# Patient Record
Sex: Female | Born: 1980 | Hispanic: No | Marital: Single | Smoking: Never smoker
Health system: Southern US, Community
[De-identification: ages and names within clinical notes are randomized; demographics above are authoritative.]

---

## 2000-11-16 ENCOUNTER — Inpatient Hospital Stay (HOSPITAL_COMMUNITY): Admission: AD | Admit: 2000-11-16 | Discharge: 2000-11-16 | Payer: Self-pay | Admitting: Internal Medicine

## 2000-11-16 ENCOUNTER — Encounter: Payer: Self-pay | Admitting: Gynecology

## 2001-02-25 ENCOUNTER — Inpatient Hospital Stay (HOSPITAL_COMMUNITY): Admission: AD | Admit: 2001-02-25 | Discharge: 2001-02-25 | Payer: Self-pay | Admitting: *Deleted

## 2001-02-25 ENCOUNTER — Inpatient Hospital Stay (HOSPITAL_COMMUNITY): Admission: AD | Admit: 2001-02-25 | Discharge: 2001-02-27 | Payer: Self-pay | Admitting: Gynecology

## 2001-04-21 ENCOUNTER — Other Ambulatory Visit: Admission: RE | Admit: 2001-04-21 | Discharge: 2001-04-21 | Payer: Self-pay | Admitting: Gynecology

## 2003-03-08 ENCOUNTER — Inpatient Hospital Stay (HOSPITAL_COMMUNITY): Admission: RE | Admit: 2003-03-08 | Discharge: 2003-03-09 | Payer: Self-pay | Admitting: Obstetrics and Gynecology

## 2018-01-01 ENCOUNTER — Other Ambulatory Visit (HOSPITAL_COMMUNITY): Payer: Self-pay | Admitting: Nurse Practitioner

## 2018-01-01 DIAGNOSIS — R102 Pelvic and perineal pain: Secondary | ICD-10-CM

## 2018-01-09 ENCOUNTER — Ambulatory Visit (HOSPITAL_COMMUNITY)
Admission: RE | Admit: 2018-01-09 | Discharge: 2018-01-09 | Disposition: A | Discharge status: Home / Self Care | Payer: Self-pay | Source: Ambulatory Visit | Attending: Nurse Practitioner | Admitting: Nurse Practitioner

## 2018-01-09 DIAGNOSIS — R102 Pelvic and perineal pain: Secondary | ICD-10-CM | POA: Insufficient documentation

## 2018-01-15 ENCOUNTER — Other Ambulatory Visit (HOSPITAL_COMMUNITY): Payer: Self-pay | Admitting: Nurse Practitioner

## 2018-01-15 DIAGNOSIS — N2889 Other specified disorders of kidney and ureter: Secondary | ICD-10-CM

## 2018-01-20 ENCOUNTER — Ambulatory Visit (HOSPITAL_COMMUNITY)
Admission: RE | Admit: 2018-01-20 | Discharge: 2018-01-20 | Disposition: A | Discharge status: Home / Self Care | Payer: Self-pay | Source: Ambulatory Visit | Attending: Nurse Practitioner | Admitting: Nurse Practitioner

## 2018-01-20 DIAGNOSIS — R102 Pelvic and perineal pain: Secondary | ICD-10-CM | POA: Insufficient documentation

## 2018-01-20 DIAGNOSIS — N2889 Other specified disorders of kidney and ureter: Secondary | ICD-10-CM | POA: Insufficient documentation

## 2018-03-10 ENCOUNTER — Ambulatory Visit (INDEPENDENT_AMBULATORY_CARE_PROVIDER_SITE_OTHER): Payer: Self-pay | Admitting: Urology

## 2018-03-10 DIAGNOSIS — Q6231 Congenital ureterocele, orthotopic: Secondary | ICD-10-CM

## 2018-03-11 ENCOUNTER — Other Ambulatory Visit: Payer: Self-pay | Admitting: Urology

## 2018-03-11 DIAGNOSIS — Q6231 Congenital ureterocele, orthotopic: Secondary | ICD-10-CM

## 2018-07-05 ENCOUNTER — Other Ambulatory Visit: Payer: Self-pay

## 2018-07-05 ENCOUNTER — Emergency Department (HOSPITAL_COMMUNITY)
Admission: EM | Admit: 2018-07-05 | Discharge: 2018-07-05 | Disposition: A | Discharge status: Home / Self Care | Payer: Self-pay | Attending: Emergency Medicine | Admitting: Emergency Medicine

## 2018-07-05 ENCOUNTER — Encounter (HOSPITAL_COMMUNITY): Payer: Self-pay

## 2018-07-05 ENCOUNTER — Emergency Department (HOSPITAL_COMMUNITY): Payer: Self-pay

## 2018-07-05 DIAGNOSIS — R102 Pelvic and perineal pain: Secondary | ICD-10-CM

## 2018-07-05 DIAGNOSIS — N939 Abnormal uterine and vaginal bleeding, unspecified: Secondary | ICD-10-CM | POA: Insufficient documentation

## 2018-07-05 LAB — URINALYSIS, ROUTINE W REFLEX MICROSCOPIC
Bacteria, UA: NONE SEEN
Bilirubin Urine: NEGATIVE
Glucose, UA: NEGATIVE mg/dL
Ketones, ur: NEGATIVE mg/dL
Leukocytes, UA: NEGATIVE
Nitrite: NEGATIVE
Protein, ur: NEGATIVE mg/dL
Specific Gravity, Urine: 1.009 (ref 1.005–1.030)
pH: 6 (ref 5.0–8.0)

## 2018-07-05 LAB — CBC WITH DIFFERENTIAL/PLATELET
BASOS PCT: 0 %
Basophils Absolute: 0 10*3/uL (ref 0.0–0.1)
EOS PCT: 2 %
Eosinophils Absolute: 0.2 10*3/uL (ref 0.0–0.7)
HCT: 39.7 % (ref 36.0–46.0)
Hemoglobin: 13.1 g/dL (ref 12.0–15.0)
Lymphocytes Relative: 35 %
Lymphs Abs: 3.5 10*3/uL (ref 0.7–4.0)
MCH: 31.1 pg (ref 26.0–34.0)
MCHC: 33 g/dL (ref 30.0–36.0)
MCV: 94.3 fL (ref 78.0–100.0)
MONO ABS: 0.6 10*3/uL (ref 0.1–1.0)
Monocytes Relative: 6 %
Neutro Abs: 5.8 10*3/uL (ref 1.7–7.7)
Neutrophils Relative %: 57 %
PLATELETS: 291 10*3/uL (ref 150–400)
RBC: 4.21 MIL/uL (ref 3.87–5.11)
RDW: 13.5 % (ref 11.5–15.5)
WBC: 10.1 10*3/uL (ref 4.0–10.5)

## 2018-07-05 LAB — BASIC METABOLIC PANEL
ANION GAP: 6 (ref 5–15)
BUN: 14 mg/dL (ref 6–20)
CALCIUM: 8.9 mg/dL (ref 8.9–10.3)
CO2: 22 mmol/L (ref 22–32)
Chloride: 110 mmol/L (ref 98–111)
Creatinine, Ser: 0.46 mg/dL (ref 0.44–1.00)
GFR calc Af Amer: >60 mL/min (ref >60)
GLUCOSE: 109 mg/dL — AB (ref 70–99)
Potassium: 4.1 mmol/L (ref 3.5–5.1)
Sodium: 138 mmol/L (ref 135–145)

## 2018-07-05 LAB — PREGNANCY, URINE: PREG TEST UR: NEGATIVE

## 2018-07-05 MED ORDER — MEGESTROL ACETATE 40 MG PO TABS: 80.0000 mg | ORAL_TABLET | Freq: Two times a day (BID) | ORAL | 0 refills | Status: AC

## 2018-07-05 MED ORDER — MEGESTROL ACETATE 40 MG PO TABS
80.0000 mg | ORAL_TABLET | Freq: Once | ORAL | Status: AC
  Administered 2018-07-05: 80 mg via ORAL
  Filled 2018-07-05 (×2): qty 2

## 2018-07-05 MED ORDER — OXYCODONE-ACETAMINOPHEN 5-325 MG PO TABS
ORAL_TABLET | ORAL | Status: AC
  Filled 2018-07-05: qty 2

## 2018-07-05 MED ORDER — OXYCODONE-ACETAMINOPHEN 5-325 MG PO TABS
2.0000 | ORAL_TABLET | Freq: Once | ORAL | Status: AC
  Administered 2018-07-05: 2 via ORAL

## 2018-07-05 NOTE — ED Triage Notes (Signed)
Pt c/o heavy bleeding with pain on the lt side x 3days

## 2018-07-05 NOTE — ED Provider Notes (Signed)
[facility] EMERGENCY DEPARTMENT Provider Note   CSN: [id number] Arrival date & time: 07/05/18  0425     History   Chief Complaint Chief Complaint  Patient presents with  . Menstrual Problem    HPI Misty Sandoval is a 37 y.o. female.  Patient is a 37 year old female presenting with complaints of vaginal bleeding and left lower quadrant pain.  She states that she started her menstrual period on August 28 and reports continued bleeding since.  She is also now experiencing some pain in her left lower quadrant.  She denies any fevers or chills.  She was seen by another physician for this complaint and was started on Provera.  She has been taking 5 mg daily for the past 9 days, however this has not helped.  She denies any fevers or chills.  She denies any urinary complaints.  The history is provided by the patient.    History reviewed. No pertinent past medical history.  There are no active problems to display for this patient.   History reviewed. No pertinent surgical history.   OB History    Gravida  2   Para  2   Term      Preterm      AB      Living        SAB      TAB      Ectopic      Multiple      Live Births               Home Medications    Prior to Admission medications   Not on File    Family History History reviewed. No pertinent family history.  Social History Social History   Tobacco Use  . Smoking status: Never Smoker  . Smokeless tobacco: Never Used  Substance Use Topics  . Alcohol use: Never    Frequency: Never  . Drug use: Never     Allergies   Patient has no allergy information on record.   Review of Systems Review of Systems  All other systems reviewed and are negative.    Physical Exam Updated Vital Signs BP (!) 152/92 (BP Location: Left Arm)   Pulse 98   Temp 98 F (36.7 C) (Oral)   Resp 18   Ht 5\' 4"  (1.626 m)   Wt 68 kg   LMP 07/05/2018 Comment: on it now  SpO2 98%   BMI 25.75 kg/m     Physical Exam  Constitutional: She is oriented to person, place, and time. She appears well-developed and well-nourished. No distress.  HENT:  Head: Normocephalic and atraumatic.  Neck: Normal range of motion. Neck supple.  Cardiovascular: Normal rate and regular rhythm. Exam reveals no gallop and no friction rub.  No murmur heard. Pulmonary/Chest: Effort normal and breath sounds normal. No respiratory distress. She has no wheezes.  Abdominal: Soft. Bowel sounds are normal. She exhibits no distension. There is tenderness. There is no rebound and no guarding.  There is mild tenderness to palpation in the left lower quadrant.  Genitourinary:  Genitourinary Comments: Pelvic exam just reveals blood in the vaginal vault.  There is some tenderness in the left adnexa, however no palpable mass.  Musculoskeletal: Normal range of motion.  Neurological: She is alert and oriented to person, place, and time.  Skin: Skin is warm and dry. She is not diaphoretic.  Nursing note and vitals reviewed.    ED Treatments / Results  Labs (all labs ordered  are listed, but only abnormal results are displayed) Labs Reviewed  URINALYSIS, ROUTINE W REFLEX MICROSCOPIC - Abnormal; Notable for the following components:      Result Value   Color, Urine STRAW (*)    Hgb urine dipstick LARGE (*)    All other components within normal limits  WET PREP, GENITAL  PREGNANCY, URINE  BASIC METABOLIC PANEL  CBC WITH DIFFERENTIAL/PLATELET  GC/CHLAMYDIA PROBE AMP ([place]) NOT AT [facility]    EKG None  Radiology No results found.  Procedures Procedures (including critical care time)  Medications Ordered in ED Medications - No data to display   Initial Impression / Assessment and Plan / ED Course  I have reviewed the triage vital signs and the nursing notes.  Pertinent labs & imaging results that were available during my care of the patient were reviewed by me and considered in my medical decision making  (see chart for details).  Patient presenting with vaginal bleeding that has been ongoing for several weeks.  She states that her last menstrual period never ended and bleeding has worsened over the past several days despite taking Provera 5 mg daily.  She is hemodynamically stable and blood counts are normal.  She has no white count and no fever. She is experiencing some left lower quadrant pain and will undergo an ultrasound to evaluate further.  If nothing emergent shows up on the ultrasound, she will be discharged with an increased dose of progesterone.  I have spoken with Dr. Macon LargeAnyanwu who is recommending Megace.  There will be signed out to Dr. Hyacinth MeekerMiller at shift change.  He will obtain the results of the ultrasound and determine the final disposition.  Final Clinical Impressions(s) / ED Diagnoses   Final diagnoses:  None    ED Discharge Orders    None       Geoffery Lyonselo, Kaipo Ardis, MD 07/05/18 [phone]

## 2018-07-05 NOTE — ED Provider Notes (Signed)
US neg Pt informed Rx for megace give Pt stable for d/c F/u with GYN Expressed understanding   Eber HongMiller, Adreonna Yontz, MD 07/05/18 1036

## 2018-07-05 NOTE — Discharge Instructions (Addendum)
Begin taking Megace as prescribed today.  Follow-up with your GYN in the next 3 to 4 days, and return to the ER if bleeding or pain significantly worsen.

## 2019-03-09 ENCOUNTER — Ambulatory Visit: Payer: Self-pay | Admitting: Sports Medicine

## 2020-01-01 IMAGING — US US PELVIS COMPLETE TRANSABD/TRANSVAG
1 series · 13 of 25 positions shown · non-contrast
Comparison: None

CLINICAL DATA: Initial evaluation for pelvic pain and left lower
quadrant pain for 3 months.



[Series 1: us pelvis complete transabd/transvag · 0.27mm/px · 13 of 80 slices shown]
[im 1/80]
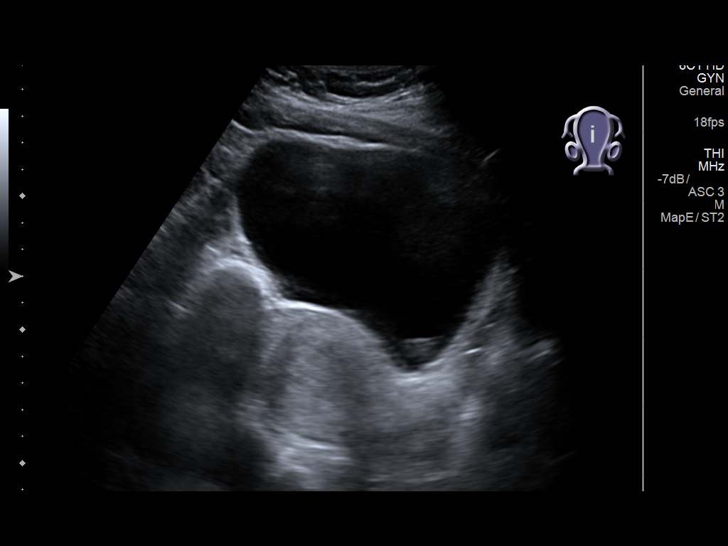
[im 7/80]
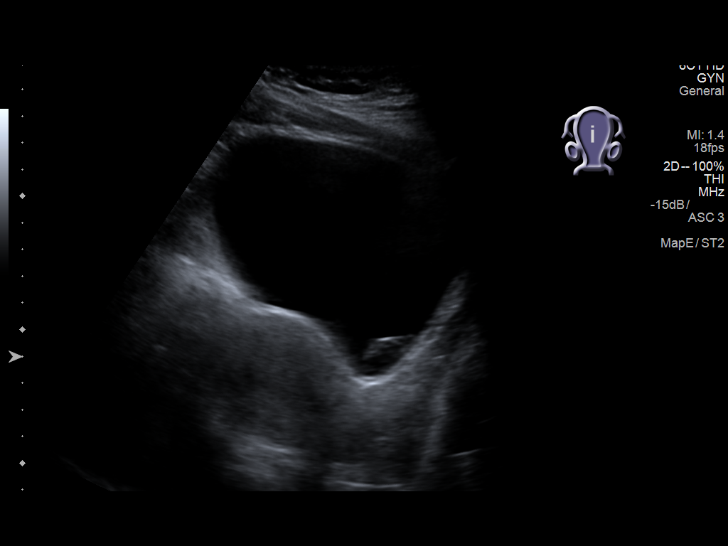
[im 14/80]
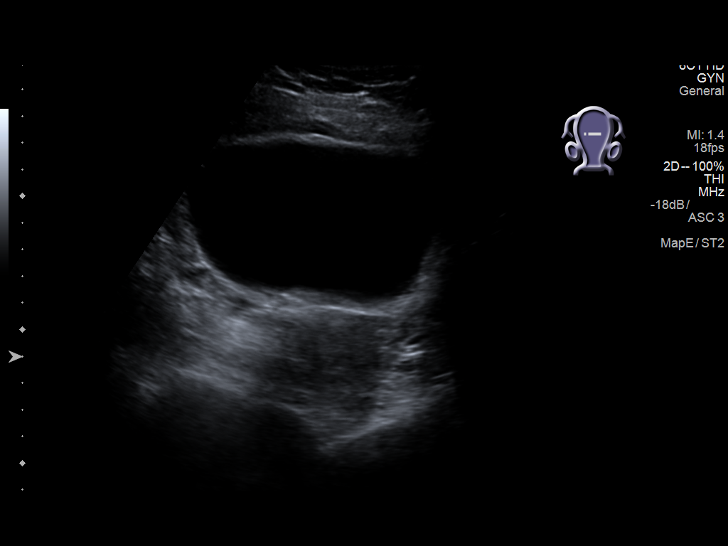
[im 20/80]
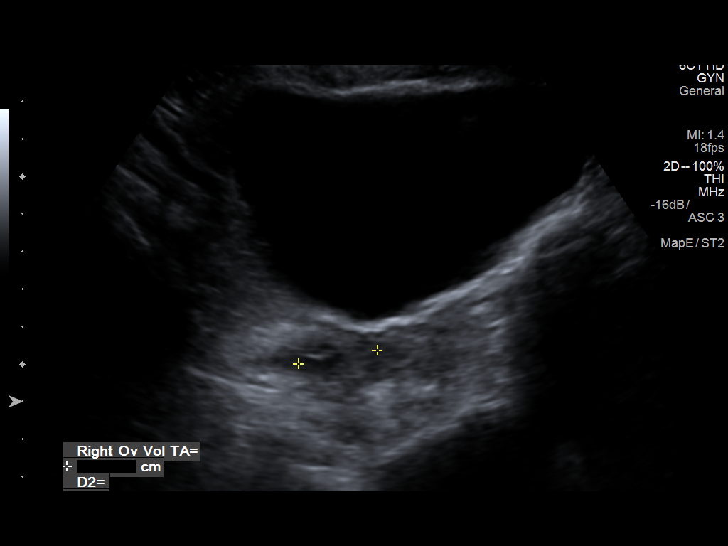
[im 27/80]
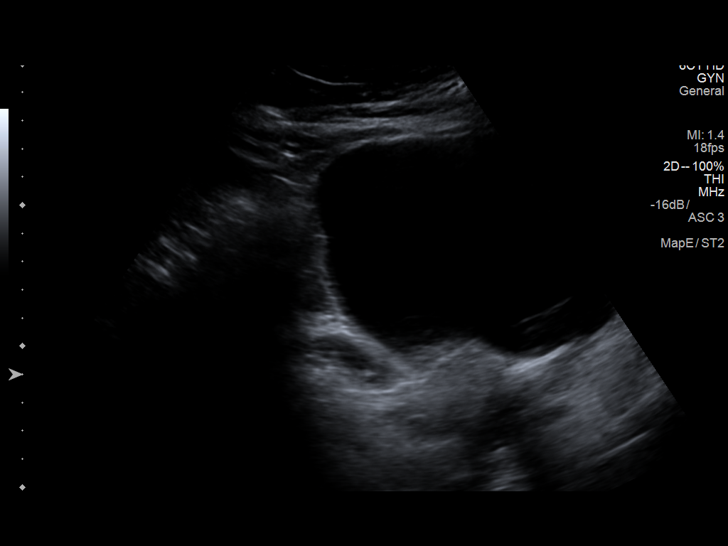
[im 33/80]
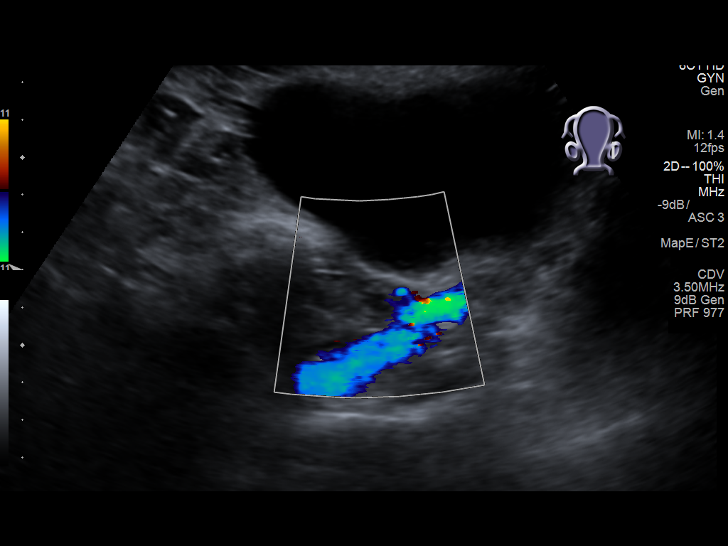
[im 40/80]
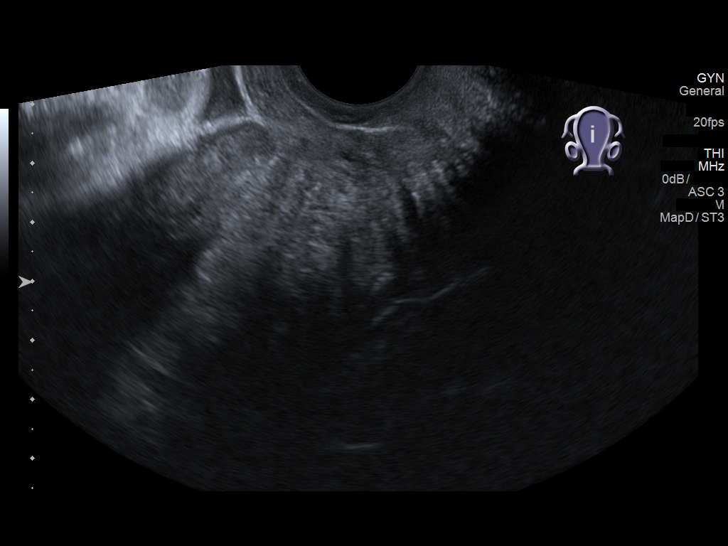
[im 47/80]
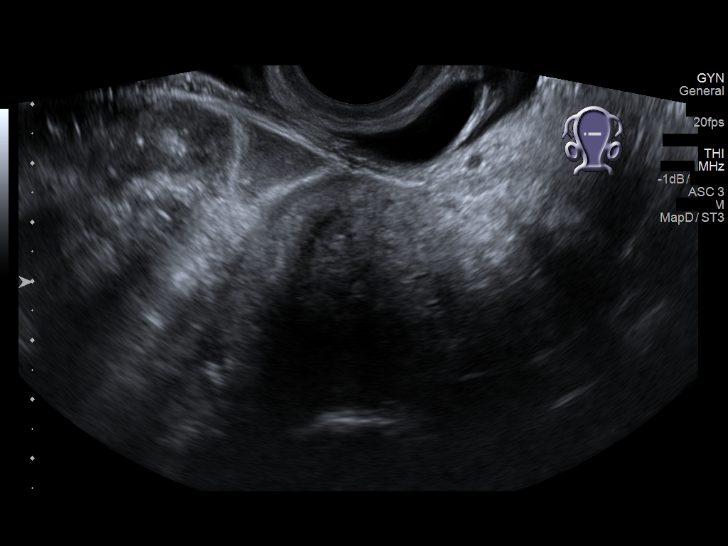
[im 53/80]
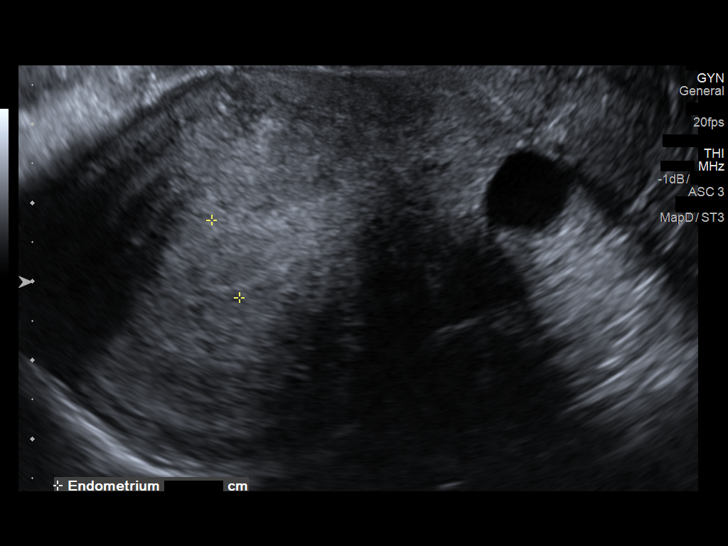
[im 60/80]
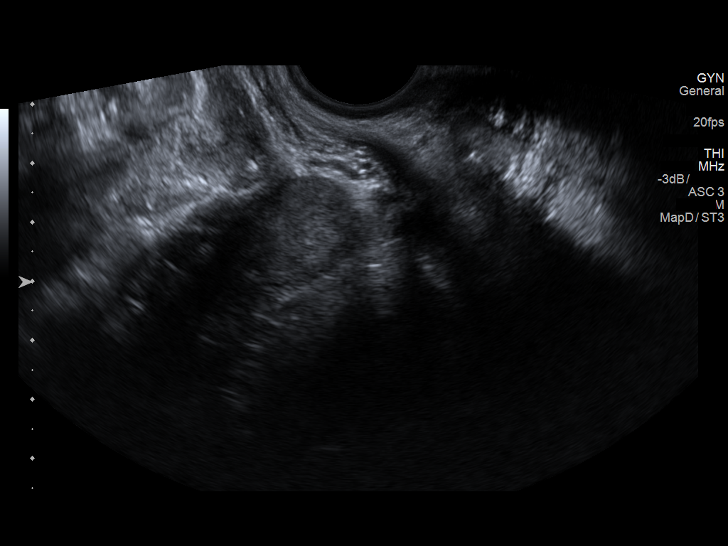
[im 66/80]
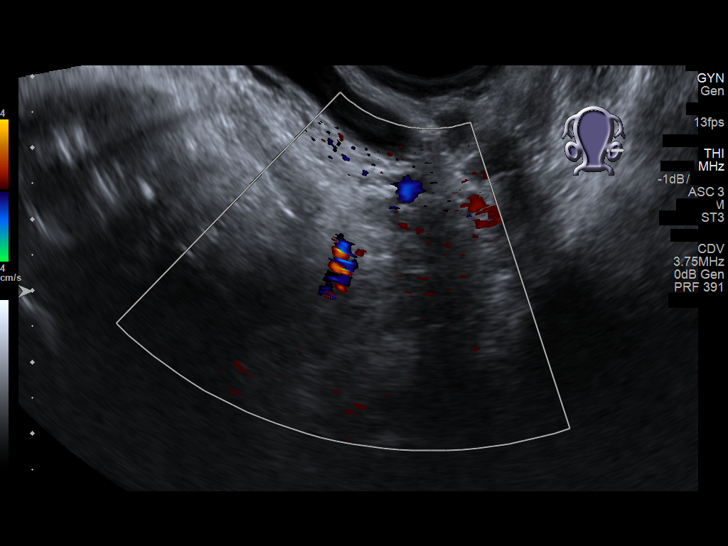
[im 73/80]
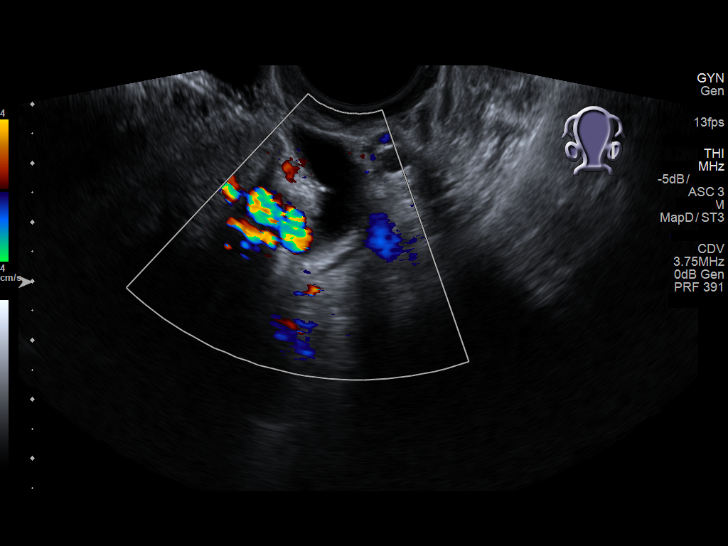
[im 80/80]
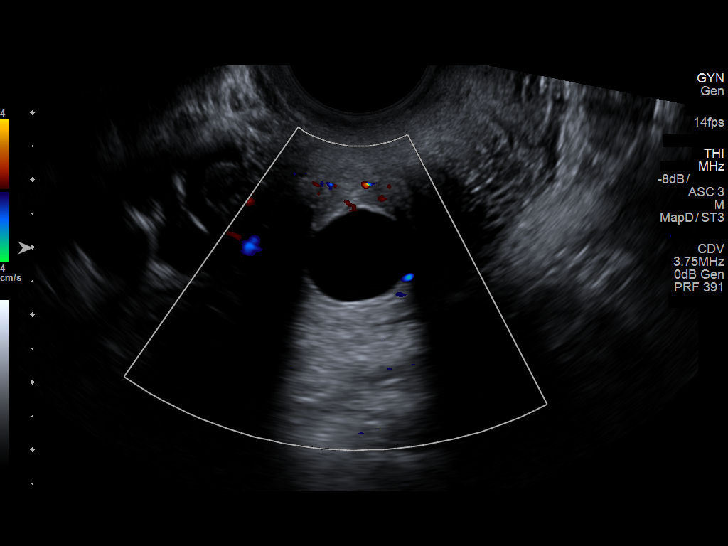

[13 of 25 positions shown; findings below may reference images not displayed]

FINDINGS: Uterus

Measurements: 9.1 x 4.9 x 5.2 cm. No fibroids or other mass
visualized. 1.7 x 1.4 x 1.8 cm nabothian cyst noted at the cervix.

Endometrium

Thickness: 7 mm.  No focal abnormality visualized.

Right ovary

Measurements: 2.8 x 1.4 x 1.1 cm. 1.6 cm simple cyst most consistent
with a normal physiologic cyst/dominant follicle.

Left ovary

Measurements: 2.9 x 1.4 x 1.6 cm. Normal appearance/no adnexal mass.

Other findings

No abnormal free fluid.

Curvilinear echogenic lesion within the bladder lumen suggestive of
ureterocele.
IMPRESSION: 1. Negative pelvic ultrasound.  No acute abnormality identified.
2. Incidental 1.8 cm nabothian cyst at the cervix.
3. Suspected small bladder ureterocele.

## 2020-06-26 IMAGING — US US TRANSVAGINAL NON-OB
1 series · 14 of 25 positions shown · non-contrast
Comparison: Pelvic ultrasound dated 01/09/2018

CLINICAL DATA: Left lower quadrant pain, heavy vaginal bleeding

EXAM:
TRANSABDOMINAL AND TRANSVAGINAL ULTRASOUND OF PELVIS
TECHNIQUE: Both transabdominal and transvaginal ultrasound examinations of the
pelvis were performed. Transabdominal technique was performed for
global imaging of the pelvis including uterus, ovaries, adnexal
regions, and pelvic cul-de-sac. It was necessary to proceed with
endovaginal exam following the transabdominal exam to visualize the
endometrium and bilateral ovaries.

[Series 1: us transvaginal non-ob · 0.23mm/px · 14 of 114 slices shown]
[im 1/114]
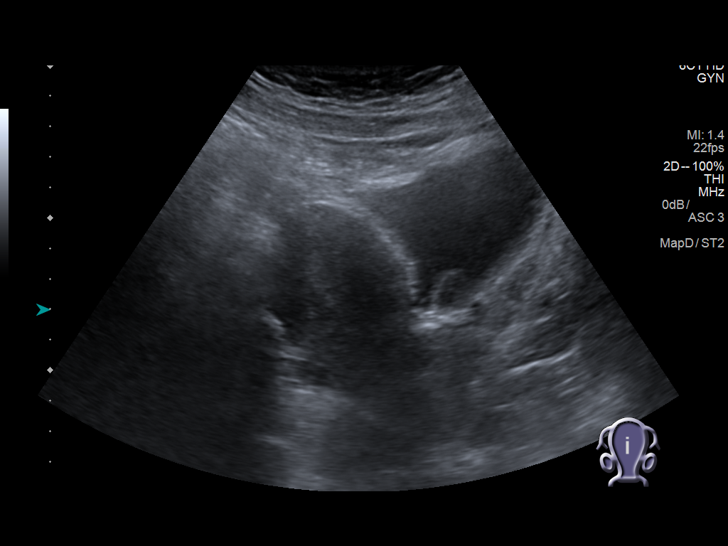
[im 10/114]
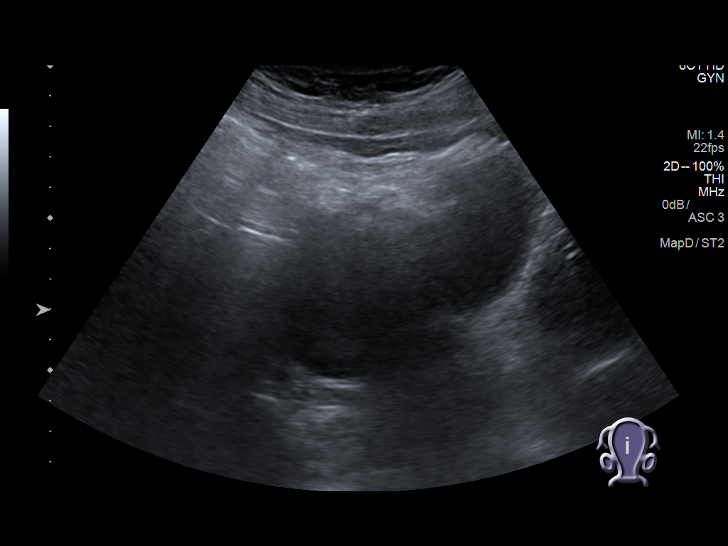
[im 19/114]
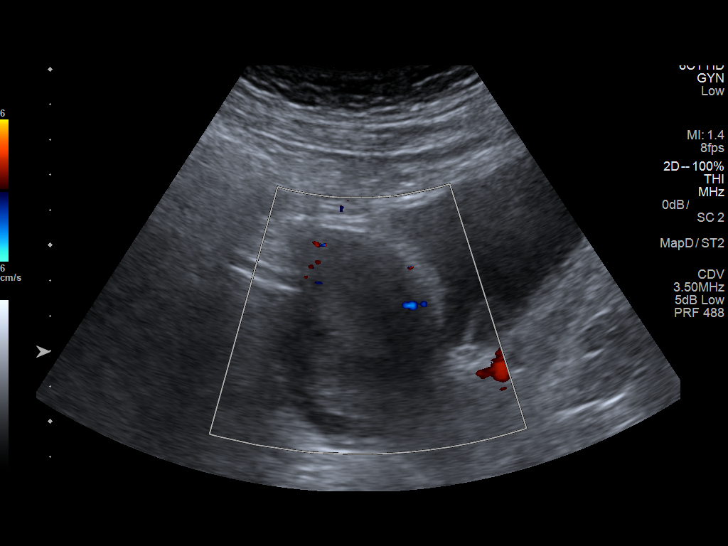
[im 29/114]
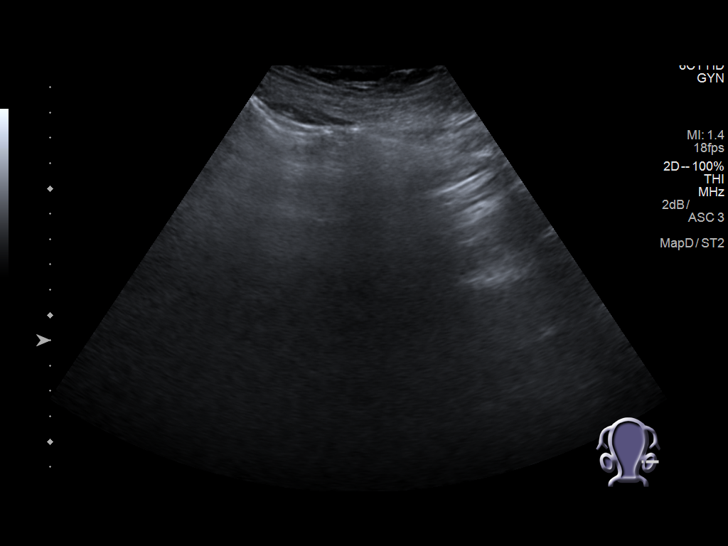
[im 38/114]
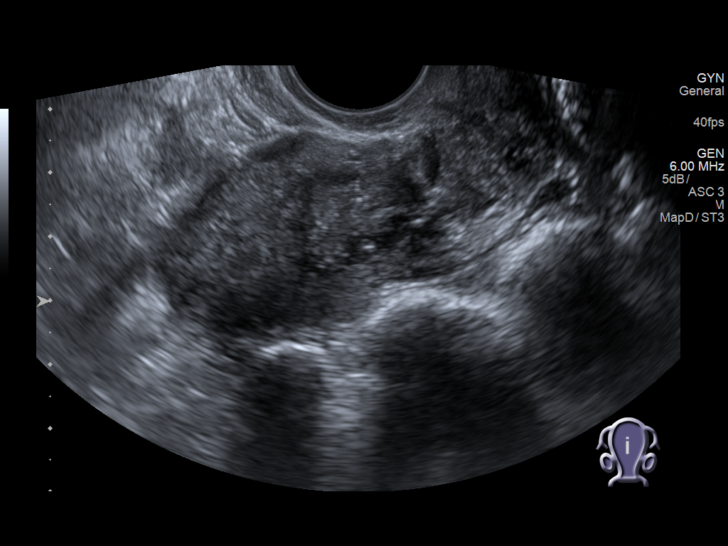
[im 43/114]
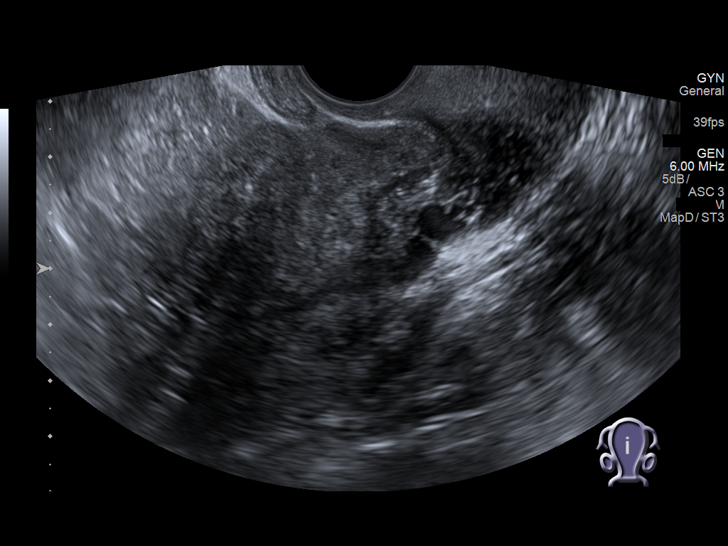
[im 52/114]
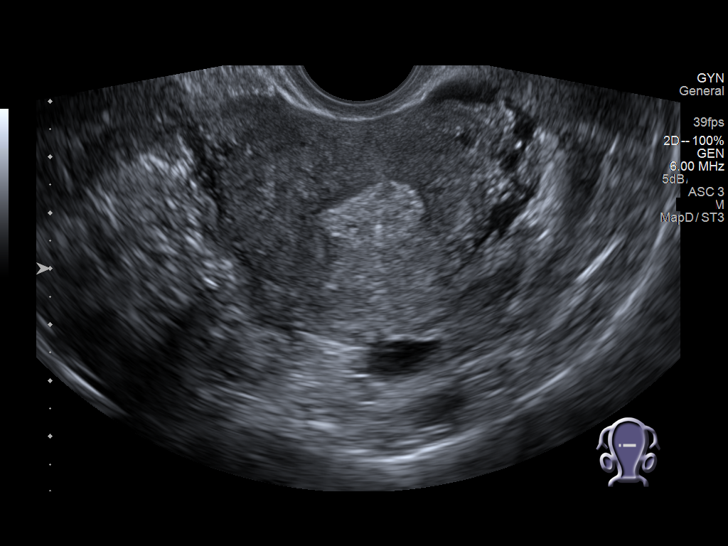
[im 62/114]
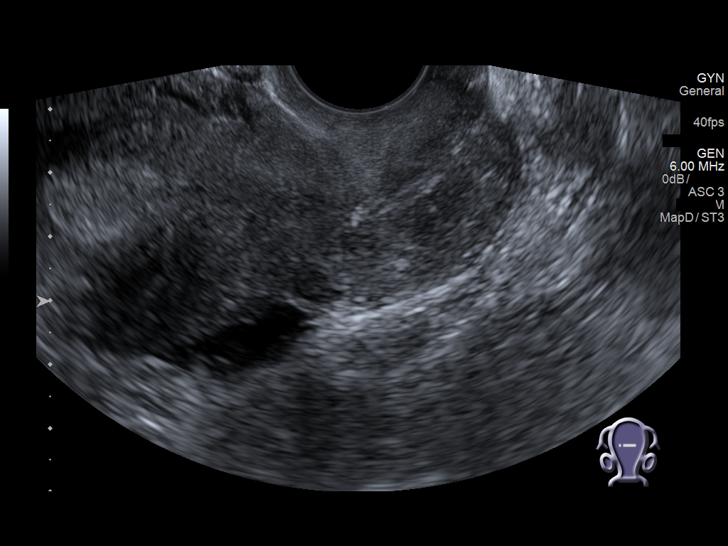
[im 71/114]
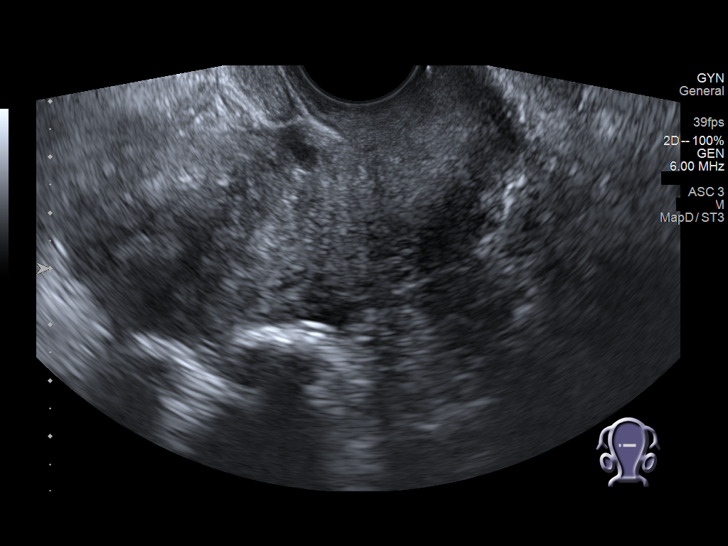
[im 76/114]
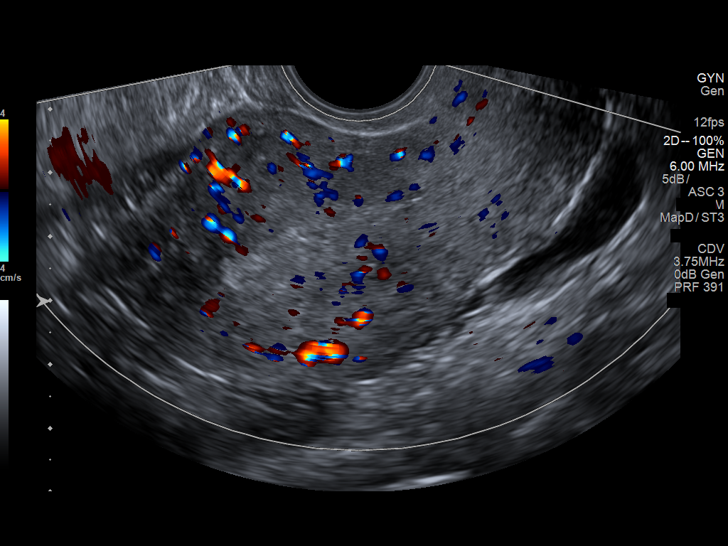
[im 85/114]
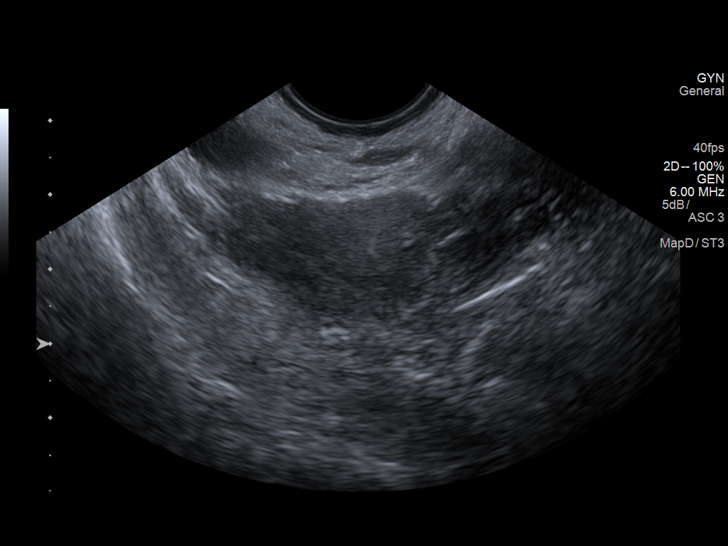
[im 95/114]
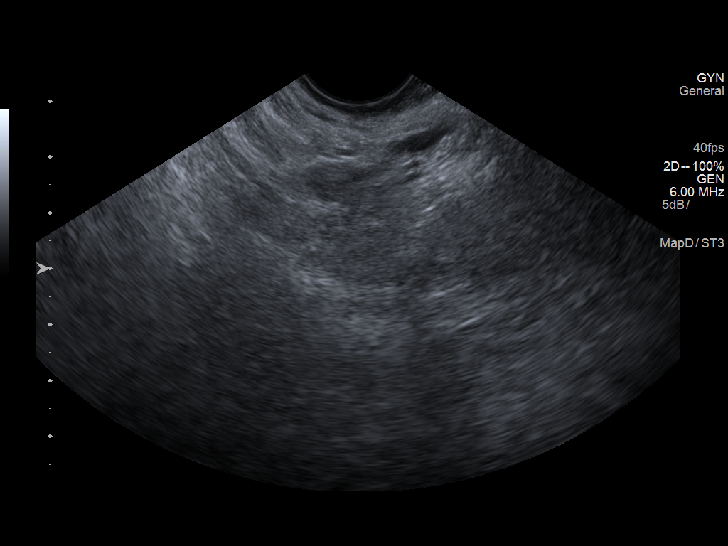
[im 104/114]
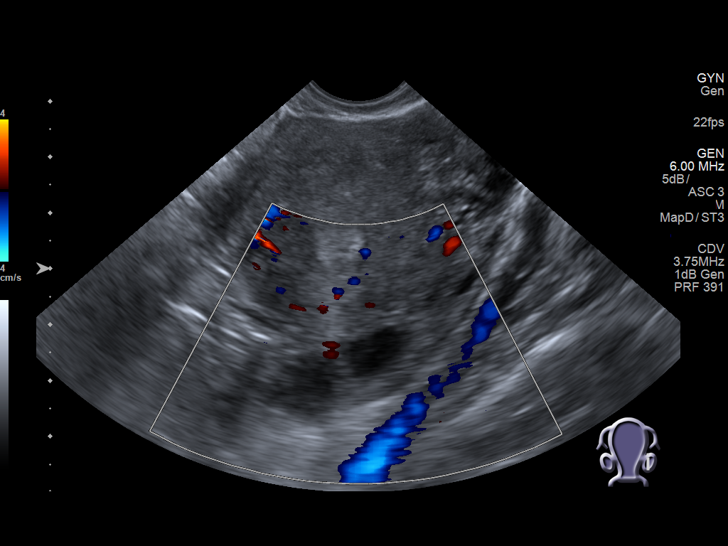
[im 114/114]
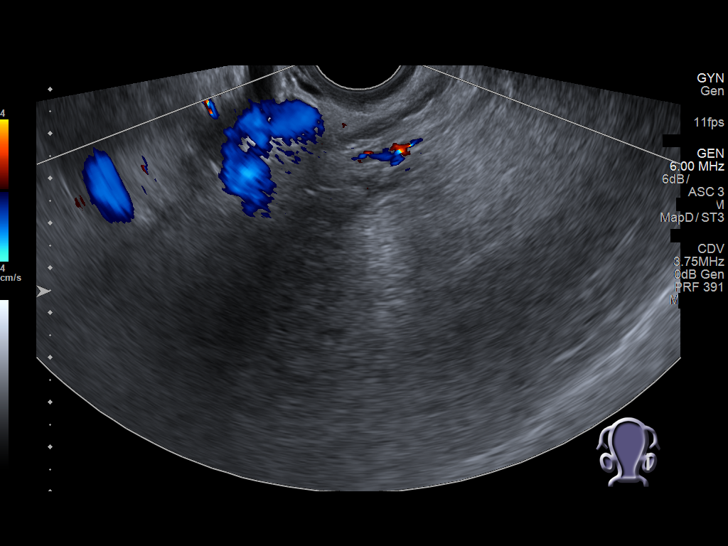

[14 of 25 positions shown; findings below may reference images not displayed]

FINDINGS: Uterus

Measurements: 9.0 x 4.3 x 5.7 cm. No fibroids or other mass
visualized.

Endometrium

Thickness: 11 mm.  No focal abnormality visualized.

Right ovary

Measurements: 2.8 x 1.5 x 2.4 cm. Normal appearance/no adnexal mass.

Left ovary

Measurements: 3.8 x 1.4 x 2.7 cm. Normal appearance/no adnexal mass.

Other findings

Small volume pelvic fluid.
IMPRESSION: Endometrial complex measures 11 mm, within normal limits.

Negative pelvic ultrasound.

## 2021-04-30 ENCOUNTER — Ambulatory Visit (INDEPENDENT_AMBULATORY_CARE_PROVIDER_SITE_OTHER): Payer: Self-pay | Admitting: Podiatry

## 2021-04-30 ENCOUNTER — Other Ambulatory Visit: Payer: Self-pay

## 2021-04-30 DIAGNOSIS — M722 Plantar fascial fibromatosis: Secondary | ICD-10-CM

## 2021-04-30 NOTE — Progress Notes (Signed)
   Subjective: 40 y.o. female presenting as a referral from Dr. Elijah Birk, local podiatrist for plantar fasciitis of the right foot that is been going on for few years now.  Patient continues to have pain and tenderness.  She has received steroid injections and wears custom orthotics with minimal improvement.  She presents for surgical consult and to discuss further treatment options.   No past medical history on file.   Objective: Physical Exam General: The patient is alert and oriented x3 in no acute distress.  Dermatology: Skin is warm, dry and supple bilateral lower extremities. Negative for open lesions or macerations bilateral.   Vascular: Dorsalis Pedis and Posterior Tibial pulses palpable bilateral.  Capillary fill time is immediate to all digits.  Neurological: Epicritic and protective threshold intact bilateral.   Musculoskeletal: Tenderness to palpation to the plantar aspect of the right heel along the plantar fascia. All other joints range of motion within normal limits bilateral. Strength 5/5 in all groups bilateral.   Radiographic exam: [facility] [employer] systemwide shutdown this AM.  Unable to obtain x-rays.  X-rays available at Dr. Tasia Catchings office  Assessment: 1. Plantar fasciitis right  Plan of Care:  1. Patient evaluated. Xrays reviewed.   2. Today we discussed the conservative versus surgical management of the presenting pathology. The patient opts for surgical management. All possible complications and details of the procedure were explained. All patient questions were answered. No guarantees were expressed or implied. 3. Authorization for surgery was initiated today. Surgery will consist of endoscopic plantar fasciotomy right 4.  Our billing specialist, Chip Boer, will contact the patient for a price since she is a self-pay 5.  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 Felecia Shelling, DPM    [address] [place], [place] [address]                Office [phone]  Fax [phone]

## 2021-06-07 ENCOUNTER — Other Ambulatory Visit: Payer: Self-pay | Admitting: Podiatry

## 2021-06-07 ENCOUNTER — Encounter: Payer: Self-pay | Admitting: Podiatry

## 2021-06-07 DIAGNOSIS — M722 Plantar fascial fibromatosis: Secondary | ICD-10-CM

## 2021-06-07 MED ORDER — OXYCODONE-ACETAMINOPHEN 5-325 MG PO TABS: 1.0000 | ORAL_TABLET | ORAL | 0 refills | Status: AC | PRN

## 2021-06-07 MED ORDER — IBUPROFEN 800 MG PO TABS: 800.0000 mg | ORAL_TABLET | Freq: Three times a day (TID) | ORAL | 1 refills | Status: AC

## 2021-06-07 NOTE — Progress Notes (Signed)
PRN postop 

## 2021-06-13 ENCOUNTER — Ambulatory Visit (INDEPENDENT_AMBULATORY_CARE_PROVIDER_SITE_OTHER): Payer: Self-pay | Admitting: Podiatry

## 2021-06-13 ENCOUNTER — Other Ambulatory Visit: Payer: Self-pay

## 2021-06-13 DIAGNOSIS — Z9889 Other specified postprocedural states: Secondary | ICD-10-CM

## 2021-06-13 NOTE — Progress Notes (Signed)
   Subjective:  Patient presents today status post endoscopic plantar fasciotomy right. DOS: 06/07/2021.  Patient states that she is feeling very well.  She is not taking any pain medication.  She is weightbearing in the cam boot.  No new complaints at this time  No past medical history on file.    Objective/Physical Exam Neurovascular status intact.  Skin incisions appear to be well coapted with sutures intact. No sign of infectious process noted. No dehiscence. No active bleeding noted.  Negative for any significant edema noted to the surgical extremity.  Assessment: 1. s/p endoscopic plantar fasciotomy right. DOS: 06/07/2021   Plan of Care:  1. Patient was evaluated.  2.  Dressings changed.  Recommend Ace wrap daily. 3.  Continue weightbearing in the cam boot x2 additional weeks 4.  Return to clinic in 1 week for suture removal   Felecia Shelling, DPM [facility]  Dr. Felecia Shelling, DPM    [address] [place], [place] [address]                Office [phone]  Fax [phone]

## 2021-06-20 ENCOUNTER — Encounter: Payer: Self-pay | Admitting: Podiatry

## 2021-06-27 ENCOUNTER — Other Ambulatory Visit: Payer: Self-pay

## 2021-06-27 ENCOUNTER — Ambulatory Visit (INDEPENDENT_AMBULATORY_CARE_PROVIDER_SITE_OTHER): Payer: Self-pay | Admitting: Podiatry

## 2021-06-27 DIAGNOSIS — Z9889 Other specified postprocedural states: Secondary | ICD-10-CM

## 2021-06-27 NOTE — Progress Notes (Signed)
   Subjective:  Patient presents today status post endoscopic plantar fasciotomy right. DOS: 06/07/2021.  Patient states that she is doing well but she did try to transition out of the cam boot and she had some pain.  She is unable to walk outside of the cam boot without pain.  She presents for further treatment and evaluation  No past medical history on file.    Objective/Physical Exam Neurovascular status intact.  Skin incisions appear to be well coapted with sutures intact. No sign of infectious process noted. No dehiscence. No active bleeding noted.  Negative for any significant edema noted to the surgical extremity.  Assessment: 1. s/p endoscopic plantar fasciotomy right. DOS: 06/07/2021   Plan of Care:  1. Patient was evaluated.  2.  Sutures removed today 3.  Continue cam boot x2 additional weeks.  Then the patient can transition out of the cam boot into good sneakers with her orthotics 4.  Continue meloxicam daily 5.  Return to clinic in 4 weeks   Felecia Shelling, DPM [facility]  Dr. Felecia Shelling, DPM    [address] [place], [place] [address]                Office [phone]  Fax [phone]

## 2021-07-04 ENCOUNTER — Encounter: Payer: Self-pay | Admitting: Podiatry

## 2021-07-30 ENCOUNTER — Encounter: Payer: Self-pay | Admitting: Podiatry

## 2021-08-08 ENCOUNTER — Other Ambulatory Visit: Payer: Self-pay

## 2021-08-08 ENCOUNTER — Ambulatory Visit (INDEPENDENT_AMBULATORY_CARE_PROVIDER_SITE_OTHER): Payer: Self-pay | Admitting: Podiatry

## 2021-08-08 DIAGNOSIS — Z9889 Other specified postprocedural states: Secondary | ICD-10-CM

## 2021-08-08 NOTE — Progress Notes (Signed)
   Subjective:  Patient presents today status post endoscopic plantar fasciotomy right. DOS: 06/07/2021.  Patient states that she is doing well.  She is back at work.  She does have some pain and tenderness at the end of the day.  Overall there is significant improvement since prior to surgery.  No past medical history on file.    Objective/Physical Exam Neurovascular status intact.  Skin incisions appear to be well coapted and healed.  Negative edema.  Negative for any significant tenderness to palpation along the incision site and plantar heel  Assessment: 1. s/p endoscopic plantar fasciotomy right. DOS: 06/07/2021   Plan of Care:  1. Patient was evaluated.  2.  Continue wearing good supportive sneakers and shoes 3.  The patient may now slowly increase to full activity no restrictions.  I explained to the patient that she should continue to heal and rehab for up to an additional 4 months 4.  Full activity no restrictions 5.  Return to clinic as needed   Felecia Shelling, DPM [facility]  Dr. Felecia Shelling, DPM    [address] [place], [place] [address]                Office [phone]  Fax [phone]

## 2022-01-28 ENCOUNTER — Other Ambulatory Visit: Payer: Self-pay | Admitting: *Deleted

## 2022-01-28 ENCOUNTER — Other Ambulatory Visit: Payer: Self-pay | Admitting: Nurse Practitioner

## 2022-01-28 DIAGNOSIS — Z1231 Encounter for screening mammogram for malignant neoplasm of breast: Secondary | ICD-10-CM

## 2022-04-08 ENCOUNTER — Telehealth: Payer: Self-pay

## 2022-04-08 ENCOUNTER — Inpatient Hospital Stay: Admission: RE | Admit: 2022-04-08 | Payer: Self-pay | Source: Ambulatory Visit

## 2022-04-17 ENCOUNTER — Ambulatory Visit
Admission: RE | Admit: 2022-04-17 | Discharge: 2022-04-17 | Disposition: A | Discharge status: Home / Self Care | Payer: Self-pay | Source: Ambulatory Visit | Attending: Nurse Practitioner | Admitting: Nurse Practitioner

## 2022-04-17 DIAGNOSIS — Z1231 Encounter for screening mammogram for malignant neoplasm of breast: Secondary | ICD-10-CM
